# Patient Record
Sex: Female | Born: 1995 | Hispanic: Yes | Marital: Single | Smoking: Never smoker
Health system: Southern US, Community
[De-identification: ages and names within clinical notes are randomized; demographics above are authoritative.]

---
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
## 2013-07-13 ENCOUNTER — Observation Stay: Payer: Self-pay | Admitting: Obstetrics and Gynecology

## 2013-07-13 LAB — URINALYSIS, COMPLETE
Bacteria: NONE SEEN
Glucose,UR: NEGATIVE mg/dL (ref 0–75)
Nitrite: NEGATIVE
Ph: 6 (ref 4.5–8.0)
RBC,UR: 42 /HPF (ref 0–5)
Specific Gravity: 1.013 (ref 1.003–1.030)

## 2013-07-13 LAB — FETAL FIBRONECTIN
Appearance: NORMAL
Fetal Fibronectin: NEGATIVE

## 2014-02-01 ENCOUNTER — Emergency Department: Payer: Self-pay | Admitting: Emergency Medicine

## 2014-02-01 LAB — CBC WITH DIFFERENTIAL/PLATELET
Basophil #: 0.1 10*3/uL (ref 0.0–0.1)
Basophil %: 0.9 %
EOS PCT: 0.9 %
Eosinophil #: 0.1 10*3/uL (ref 0.0–0.7)
HCT: 40.6 % (ref 35.0–47.0)
HGB: 13.1 g/dL (ref 12.0–16.0)
LYMPHS PCT: 35.8 %
Lymphocyte #: 3.8 10*3/uL — ABNORMAL HIGH (ref 1.0–3.6)
MCH: 27.6 pg (ref 26.0–34.0)
MCHC: 32.2 g/dL (ref 32.0–36.0)
MCV: 86 fL (ref 80–100)
Monocyte #: 0.7 x10 3/mm (ref 0.2–0.9)
Monocyte %: 6.4 %
NEUTROS ABS: 5.9 10*3/uL (ref 1.4–6.5)
NEUTROS PCT: 56 %
PLATELETS: 320 10*3/uL (ref 150–440)
RBC: 4.74 10*6/uL (ref 3.80–5.20)
RDW: 13.8 % (ref 11.5–14.5)
WBC: 10.5 10*3/uL (ref 3.6–11.0)

## 2014-02-01 LAB — COMPREHENSIVE METABOLIC PANEL
ALK PHOS: 110 U/L
ANION GAP: 10 (ref 7–16)
Albumin: 4.4 g/dL (ref 3.8–5.6)
BUN: 13 mg/dL (ref 7–18)
Bilirubin,Total: 0.3 mg/dL (ref 0.2–1.0)
CO2: 21 mmol/L (ref 21–32)
CREATININE: 0.54 mg/dL — AB (ref 0.60–1.30)
Calcium, Total: 9 mg/dL (ref 9.0–10.7)
Chloride: 109 mmol/L — ABNORMAL HIGH (ref 98–107)
Glucose: 99 mg/dL (ref 65–99)
Osmolality: 280 (ref 275–301)
Potassium: 3.3 mmol/L — ABNORMAL LOW (ref 3.5–5.1)
SGOT(AST): 36 U/L — ABNORMAL HIGH (ref 0–26)
SGPT (ALT): 64 U/L (ref 12–78)
SODIUM: 140 mmol/L (ref 136–145)
TOTAL PROTEIN: 8.1 g/dL (ref 6.4–8.6)

## 2014-02-02 LAB — URINALYSIS, COMPLETE
Bacteria: NONE SEEN
Bilirubin,UR: NEGATIVE
Glucose,UR: NEGATIVE mg/dL (ref 0–75)
Leukocyte Esterase: NEGATIVE
NITRITE: NEGATIVE
Ph: 5 (ref 4.5–8.0)
SPECIFIC GRAVITY: 1.025 (ref 1.003–1.030)
WBC UR: 6 /HPF (ref 0–5)

## 2014-02-02 LAB — WET PREP, GENITAL

## 2014-02-02 LAB — GC/CHLAMYDIA PROBE AMP

## 2015-08-05 ENCOUNTER — Emergency Department
Admission: EM | Admit: 2015-08-05 | Discharge: 2015-08-05 | Disposition: A | Discharge status: Home / Self Care | Payer: Worker's Compensation | Attending: Emergency Medicine | Admitting: Emergency Medicine

## 2015-08-05 ENCOUNTER — Encounter: Payer: Self-pay | Admitting: Emergency Medicine

## 2015-08-05 DIAGNOSIS — Y288XXA Contact with other sharp object, undetermined intent, initial encounter: Secondary | ICD-10-CM | POA: Diagnosis not present

## 2015-08-05 DIAGNOSIS — Y9389 Activity, other specified: Secondary | ICD-10-CM | POA: Diagnosis not present

## 2015-08-05 DIAGNOSIS — S61412A Laceration without foreign body of left hand, initial encounter: Secondary | ICD-10-CM

## 2015-08-05 DIAGNOSIS — Y990 Civilian activity done for income or pay: Secondary | ICD-10-CM | POA: Diagnosis not present

## 2015-08-05 DIAGNOSIS — Y9289 Other specified places as the place of occurrence of the external cause: Secondary | ICD-10-CM | POA: Diagnosis not present

## 2015-08-05 DIAGNOSIS — Z23 Encounter for immunization: Secondary | ICD-10-CM | POA: Insufficient documentation

## 2015-08-05 DIAGNOSIS — S61012A Laceration without foreign body of left thumb without damage to nail, initial encounter: Secondary | ICD-10-CM | POA: Insufficient documentation

## 2015-08-05 MED ORDER — LIDOCAINE-EPINEPHRINE (PF) 1 %-1:200000 IJ SOLN
INTRAMUSCULAR | Status: AC
  Filled 2015-08-05: qty 30

## 2015-08-05 MED ORDER — TETANUS-DIPHTH-ACELL PERTUSSIS 5-2.5-18.5 LF-MCG/0.5 IM SUSP
0.5000 mL | Freq: Once | INTRAMUSCULAR | Status: AC
  Administered 2015-08-05: 0.5 mL via INTRAMUSCULAR
  Filled 2015-08-05: qty 0.5

## 2015-08-05 MED ORDER — LIDOCAINE HCL (PF) 1 % IJ SOLN
INTRAMUSCULAR | Status: AC
  Filled 2015-08-05: qty 10

## 2015-08-05 MED ORDER — HYDROCODONE-ACETAMINOPHEN 5-325 MG PO TABS: 1.0000 | ORAL_TABLET | ORAL | Status: AC | PRN

## 2015-08-05 MED ORDER — LIDOCAINE-EPINEPHRINE (PF) 2 %-1:200000 IJ SOLN
20.0000 mL | Freq: Once | INTRAMUSCULAR | Status: DC
  Filled 2015-08-05: qty 20

## 2015-08-05 NOTE — ED Provider Notes (Signed)
[facility] Emergency Department Provider Note  ____________________________________________  Time seen: Approximately 9:26 AM  I have reviewed the triage vital signs and the nursing notes.   HISTORY  Chief Complaint Laceration    HPI Yolanda Rodriguez is a 19 y.o. female presents emergency department complaining of laceration to her left hand.She states that she was at work using a hooked knife when it slipped and cut her hand. She states she immediately covered the area and was able to stop bleeding prior to arrival. She states that she does not have any bleeding disorders and is not on any blood thinners. Any numbness or tingling into her distal phalanges. She reports she still has range of motion to thumb and all fingers.   History reviewed. No pertinent past medical history.  There are no active problems to display for this patient.   History reviewed. No pertinent past surgical history.  Current Outpatient Rx  Name  Route  Sig  Dispense  Refill  . HYDROcodone-acetaminophen (NORCO/VICODIN) 5-325 MG tablet   Oral   Take 1 tablet by mouth every 4 (four) hours as needed for moderate pain.   10 tablet   0     Allergies Review of patient's allergies indicates no known allergies.  No family history on file.  Social History Social History  Substance Use Topics  . Smoking status: Never Smoker   . Smokeless tobacco: None  . Alcohol Use: No    Review of Systems Constitutional: No fever/chills Eyes: No visual changes. ENT: No sore throat. Cardiovascular: Denies chest pain. Respiratory: Denies shortness of breath. Gastrointestinal: No abdominal pain.  No nausea, no vomiting.  No diarrhea.  No constipation. Genitourinary: Negative for dysuria. Musculoskeletal: Negative for back pain. Skin: Negative for rash. Endorses a laceration just proximal to the left thumb. Neurological: Negative for headaches, focal weakness or numbness.  10-point ROS  otherwise negative.  ____________________________________________   PHYSICAL EXAM:  VITAL SIGNS: ED Triage Vitals  Enc Vitals Group     BP 08/05/15 0916 121/79 mmHg     Pulse Rate 08/05/15 0916 85     Resp 08/05/15 0915 20     Temp 08/05/15 0915 98.2 F (36.8 C)     Temp Source 08/05/15 0915 Oral     SpO2 08/05/15 0916 98 %     Weight 08/05/15 0915 140 lb (63.504 kg)     Height 08/05/15 0915 5\' 1"  (1.549 m)     Head Cir --      Peak Flow --      Pain Score 08/05/15 0915 4     Pain Loc --      Pain Edu? --      Excl. in GC? --     Constitutional: Alert and oriented. Well appearing and in no acute distress. Eyes: Conjunctivae are normal. PERRL. EOMI. Head: Atraumatic. Nose: No congestion/rhinnorhea. Mouth/Throat: Mucous membranes are moist.  Oropharynx non-erythematous. Neck: No stridor.   Cardiovascular: Normal rate, regular rhythm. Grossly normal heart sounds.  Good peripheral circulation. Respiratory: Normal respiratory effort.  No retractions. Lungs CTAB. Gastrointestinal: Soft and nontender. No distention. No abdominal bruits. No CVA tenderness. Musculoskeletal: No lower extremity tenderness nor edema.  No joint effusions. Neurologic:  Normal speech and language. No gross focal neurologic deficits are appreciated. No gait instability. Skin:  Skin is warm, dry and intact. No rash noted. Deep laceration noted to the interdigital space between thumb and second finger extending to the proximal thumb. Patient has sensation and  brisk cap refill. No visible foreign body. No tendon involvement. Full range of motion to thumb and index finger. Laceration is approximately 6 cm in length. Psychiatric: Mood and affect are normal. Speech and behavior are normal.  ____________________________________________   LABS (all labs ordered are listed, but only abnormal results are displayed)  Labs Reviewed - No data to  display ____________________________________________  EKG   ____________________________________________  RADIOLOGY   ____________________________________________   PROCEDURES  Procedure(s) performed: Yes, laceration repair, see procedure note(s).   LACERATION REPAIR Performed by: Racheal Patches Authorized by: Delorise Royals Cuthriell Consent: Verbal consent obtained. Risks and benefits: risks, benefits and alternatives were discussed Consent given by: patient Patient identity confirmed: provided demographic data Prepped and Draped in normal sterile fashion Wound thoroughly explored  Laceration Location: Interdigital space left hand between thumb and second digit extending to proximal thumb  Laceration Length: 6 cm  No Foreign Bodies seen or palpated  Anesthesia: local infiltration  Local anesthetic: lidocaine 1 % without epinephrine  Anesthetic total: 15 ml  Irrigation method: syringe Amount of cleaning: standard  Skin closure: Sutured, 4-0 Ethilon   Number of sutures: 7   Technique: Simple interrupted   Patient tolerance: Patient tolerated the procedure well with no immediate complications.   Critical Care performed: No  ____________________________________________   INITIAL IMPRESSION / ASSESSMENT AND PLAN / ED COURSE  Pertinent labs & imaging results that were available during my care of the patient were reviewed by me and considered in my medical decision making (see chart for details).  The patient's history, symptoms, physical exam are consistent with laceration to left hand. There is no tendon involvement. Wound was thoroughly explored and cleansed. Wound was primarily closed using simple interrupted sutures, 7 of which were placed. Wound was well approximated. Patient had good PMS both prior to and status post procedure. Patient was given instructions for wound care. Area was bandaged prior to discharge. Patient discharged home with  prescription for pain. Otherwise patient is to return in 7-10 days for suture removal. Lyses understanding of diagnosis and treatment plan and verbalizes compliance with same. ____________________________________________   FINAL CLINICAL IMPRESSION(S) / ED DIAGNOSES  Final diagnoses:  Hand laceration, left, initial encounter      Racheal Patches, PA-C 08/05/15 1134  Loleta Rose, MD 08/05/15 1416

## 2015-08-05 NOTE — Discharge Instructions (Signed)

## 2015-08-05 NOTE — ED Notes (Signed)
Laceration to left hand at work

## 2015-08-05 NOTE — ED Notes (Signed)
States she was cleaning yarn from a machine laceration noted to left hand ..near thumb area

## 2015-08-14 ENCOUNTER — Emergency Department
Admission: EM | Admit: 2015-08-14 | Discharge: 2015-08-14 | Disposition: A | Discharge status: Home / Self Care | Payer: Worker's Compensation | Attending: Emergency Medicine | Admitting: Emergency Medicine

## 2015-08-14 ENCOUNTER — Encounter: Payer: Self-pay | Admitting: Emergency Medicine

## 2015-08-14 DIAGNOSIS — Z4802 Encounter for removal of sutures: Secondary | ICD-10-CM

## 2015-08-14 DIAGNOSIS — Z4801 Encounter for change or removal of surgical wound dressing: Secondary | ICD-10-CM | POA: Diagnosis present

## 2015-08-14 NOTE — ED Provider Notes (Signed)
[facility] Emergency Department Provider Note  ____________________________________________  Time seen: Approximately 1:43 PM  I have reviewed the triage vital signs and the nursing notes.   HISTORY  Chief Complaint Suture / Staple Removal    HPI Yolanda Rodriguez is a 19 y.o. female patient's history today for suture removal from laceration is placed on 08/05/2015. Patient was no complaint on the healing process. Patient rates her pain discomfort as a 3/10. Patient does no swelling or redness to the area.   History reviewed. No pertinent past medical history.  There are no active problems to display for this patient.   History reviewed. No pertinent past surgical history.  Current Outpatient Rx  Name  Route  Sig  Dispense  Refill  . HYDROcodone-acetaminophen (NORCO/VICODIN) 5-325 MG tablet   Oral   Take 1 tablet by mouth every 4 (four) hours as needed for moderate pain.   10 tablet   0     Allergies Review of patient's allergies indicates no known allergies.  No family history on file.  Social History Social History  Substance Use Topics  . Smoking status: Never Smoker   . Smokeless tobacco: None  . Alcohol Use: No    Review of Systems Constitutional: No fever/chills Eyes: No visual changes. ENT: No sore throat. Cardiovascular: Denies chest pain. Respiratory: Denies shortness of breath. Gastrointestinal: No abdominal pain.  No nausea, no vomiting.  No diarrhea.  No constipation. Genitourinary: Negative for dysuria. Musculoskeletal: Negative for back pain. Skin: Negative for rash. Laceration which was sutured on the left hand. Neurological: Negative for headaches, focal weakness or numbness. 10-point ROS otherwise negative.  ____________________________________________   PHYSICAL EXAM:  VITAL SIGNS: ED Triage Vitals  Enc Vitals Group     BP 08/14/15 1322 118/71 mmHg     Pulse Rate 08/14/15 1322 77     Resp 08/14/15 1322 16      Temp 08/14/15 1322 98.3 F (36.8 C)     Temp Source 08/14/15 1322 Oral     SpO2 08/14/15 1322 99 %     Weight 08/14/15 1322 140 lb (63.504 kg)     Height 08/14/15 1322 5\' 1"  (1.549 m)     Head Cir --      Peak Flow --      Pain Score 08/14/15 1324 3     Pain Loc --      Pain Edu? --      Excl. in GC? --     Constitutional: Alert and oriented. Well appearing and in no acute distress. Eyes: Conjunctivae are normal. PERRL. EOMI. Head: Atraumatic. Nose: No congestion/rhinnorhea. Mouth/Throat: Mucous membranes are moist.  Oropharynx non-erythematous. Neck: No stridor. No cervical spine tenderness to palpation. Hematological/Lymphatic/Immunilogical: No cervical lymphadenopathy. Cardiovascular: Normal rate, regular rhythm. Grossly normal heart sounds.  Good peripheral circulation. Respiratory: Normal respiratory effort.  No retractions. Lungs CTAB. Gastrointestinal: Soft and nontender. No distention. No abdominal bruits. No CVA tenderness. Musculoskeletal: No lower extremity tenderness nor edema.  No joint effusions. Neurologic:  Normal speech and language. No gross focal neurologic deficits are appreciated. No gait instability. Skin:  Skin is warm, dry and intact. No rash noted. Laceration showed good approximation with no signs symptoms secondary infection. Neurovascular intact free nuchal range of motion.  Psychiatric: Mood and affect are normal. Speech and behavior are normal.  ____________________________________________   LABS (all labs ordered are listed, but only abnormal results are displayed)  Labs Reviewed - No data to display ____________________________________________  EKG  ____________________________________________  RADIOLOGY   ____________________________________________   PROCEDURES  Procedure(s) performed: None  Critical Care performed: No  ____________________________________________   INITIAL IMPRESSION / ASSESSMENT AND PLAN / ED  COURSE  Pertinent labs & imaging results that were available during my care of the patient were reviewed by me and considered in my medical decision making (see chart for details).  Healed laceration left hand. 7 sutures removed and Steri-Strips applied. Patient given discharge instructions home care. Advised return back to ER if condition worsens. ____________________________________________   FINAL CLINICAL IMPRESSION(S) / ED DIAGNOSES  Final diagnoses:  Encounter for removal of sutures      Joni Reining, PA-C 08/14/15 1348  Sharman Cheek, MD 08/15/15 [phone]

## 2015-08-14 NOTE — ED Notes (Signed)
Suture removal from left hand.  Placed 10/29  Left hand wound well approximated, clean, and dry. No redness or swelling noted.

## 2015-08-14 NOTE — Discharge Instructions (Signed)
Remoción de la sutura, cuidados posteriores  (Suture Removal, Care After)  Siga estas instrucciones durante las próximas semanas. Estas indicaciones le proporcionan información general acerca de cómo deberá cuidarse después del procedimiento. El médico también podrá darle instrucciones más específicas. El tratamiento se ha planificado de acuerdo a las prácticas médicas actuales, pero a veces se producen problemas. Comuníquese con el médico si tiene algún problema o tiene dudas después del procedimiento.  QUÉ ESPERAR DESPUÉS DEL PROCEDIMIENTO  Después de que le retiren los puntos (suturas), es normal experimentar lo siguiente:  · Molestias e hinchazón en la zona de la herida.  · Leve enrojecimiento en la zona de la herida.  INSTRUCCIONES PARA EL CUIDADO EN EL HOGAR   · Si tiene bandas adhesivas en la piel sobre la zona de la herida, no las retire. Se caerán solas en unos pocos días. Si las bandas adhesivas siguen en su lugar después de 14 días, entonces puede retirarlas.  · Cambie los apósitos (vendajes), al menos, una vez al día o según las instrucciones del médico. Si el vendaje se adhiere, remójelo con agua jabonosa tibia.  · Solo aplique un ungüento o crema según las indicaciones del médico. Si usa crema o ungüento, lave la zona con agua y jabón dos veces por día para quitarlo todo. Enjuague el jabón y seque suavemente la zona con una toalla limpia.  · Mantenga la herida limpia y seca. Si el vendaje se moja, se ensucia o tiene mal olor, cámbielo tan pronto como pueda.  · Continúe protegiendo la herida de lesiones.  · Use protector solar cuando salga al sol. Las cicatrices nuevas se broncean fácilmente.  SOLICITE ATENCIÓN MÉDICA SI:  · Aumenta el enrojecimiento, la hinchazón o el dolor en la zona afectada.  · Observa que sale pus de la herida.  · Tiene fiebre.  · Advierte un olor fétido que proviene de la herida o del vendaje.  · La herida se abre (los bordes se separan).     Esta información no tiene como fin  reemplazar el consejo del médico. Asegúrese de hacerle al médico cualquier pregunta que tenga.     Document Released: 07/03/2005 Document Revised: 07/14/2013  Elsevier Interactive Patient Education ©2016 [employer]

## 2015-10-02 ENCOUNTER — Emergency Department
Admission: EM | Admit: 2015-10-02 | Discharge: 2015-10-02 | Disposition: A | Discharge status: Home / Self Care | Payer: Self-pay | Attending: Emergency Medicine | Admitting: Emergency Medicine

## 2015-10-02 ENCOUNTER — Encounter: Payer: Self-pay | Admitting: Emergency Medicine

## 2015-10-02 DIAGNOSIS — N6012 Diffuse cystic mastopathy of left breast: Secondary | ICD-10-CM | POA: Insufficient documentation

## 2015-10-02 DIAGNOSIS — N6011 Diffuse cystic mastopathy of right breast: Secondary | ICD-10-CM | POA: Insufficient documentation

## 2015-10-02 DIAGNOSIS — L723 Sebaceous cyst: Secondary | ICD-10-CM | POA: Insufficient documentation

## 2015-10-02 MED ORDER — NAPROXEN 500 MG PO TABS: 500.0000 mg | ORAL_TABLET | Freq: Two times a day (BID) | ORAL | Status: AC

## 2015-10-02 NOTE — ED Provider Notes (Signed)
[facility] Emergency Department Provider Note ____________________________________________  Time seen: Approximately 7:14 PM  I have reviewed the triage vital signs and the nursing notes.   HISTORY  Chief Complaint lump X 2 right breast    HPI Yolanda Rodriguez is a 19 y.o. female who presents to the emergency department for evaluation of tender lumps in right underarm and right breast.She has had a tender area under the right arm for "a long time." Breast tenderness has been present for the past few days and getting worsen. Most of the tenderness is in the right breast and she has noticed "2 lumps." Left breast also tender. She has not had a menstrual cycle in 5 months because she has an implanon.    History reviewed. No pertinent past medical history.  There are no active problems to display for this patient.   History reviewed. No pertinent past surgical history.  Current Outpatient Rx  Name  Route  Sig  Dispense  Refill  . HYDROcodone-acetaminophen (NORCO/VICODIN) 5-325 MG tablet   Oral   Take 1 tablet by mouth every 4 (four) hours as needed for moderate pain.   10 tablet   0   . naproxen (NAPROSYN) 500 MG tablet   Oral   Take 1 tablet (500 mg total) by mouth 2 (two) times daily with a meal.   60 tablet   0     Allergies Review of patient's allergies indicates no known allergies.  History reviewed. No pertinent family history.  Social History Social History  Substance Use Topics  . Smoking status: Never Smoker   . Smokeless tobacco: None  . Alcohol Use: No    Review of Systems   Constitutional: No fever/chills Eyes: No visual changes. ENT: No congestion or rhinorrhea Cardiovascular: Denies chest pain. Respiratory: Denies shortness of breath. Gastrointestinal: No abdominal pain.  No nausea, no vomiting.  No diarrhea.  No constipation. Genitourinary: Negative for dysuria. Musculoskeletal: Negative for back pain. Skin: Positive for  tenderness in the right axilla and bilateral breasts. Neurological: Negative for headaches, focal weakness or numbness.  10-point ROS otherwise negative.  ____________________________________________   PHYSICAL EXAM:  VITAL SIGNS: ED Triage Vitals  Enc Vitals Group     BP 10/02/15 1842 133/89 mmHg     Pulse Rate 10/02/15 1842 89     Resp 10/02/15 1842 16     Temp 10/02/15 1842 99 F (37.2 C)     Temp Source 10/02/15 1842 Oral     SpO2 10/02/15 1842 98 %     Weight 10/02/15 1842 140 lb (63.504 kg)     Height 10/02/15 1842  (1.549 m)     Head Cir --      Peak Flow --      Pain Score 10/02/15 1843 4     Pain Loc --      Pain Edu? --      Excl. in GC? --     Constitutional: Alert and oriented. Well appearing and in no acute distress. Eyes: Conjunctivae are normal. PERRL. EOMI. Head: Atraumatic. Nose: No congestion/rhinnorhea. Mouth/Throat: Mucous membranes are moist.  Oropharynx non-erythematous. No oral lesions. Neck: No stridor. Cardiovascular: Normal rate, regular rhythm.  Good peripheral circulation. Respiratory: Normal respiratory effort.  No retractions. Lungs CTAB. Gastrointestinal: Soft and nontender. No distention. No abdominal bruits.  Musculoskeletal: No lower extremity tenderness nor edema.  No joint effusions. Neurologic:  Normal speech and language. No gross focal neurologic deficits are appreciated. Speech is normal.  No gait instability. Skin:  Small cystic structure noted in right axilla that does not appear to be infected--not erythematous nor fluctuant; Dense, nodular breast tissue bilateral without dimpling or nipple peeling/drainage; Negative for petechiae.  Psychiatric: Mood and affect are normal. Speech and behavior are normal.  ____________________________________________   LABS (all labs ordered are listed, but only abnormal results are displayed)  Labs Reviewed - No data to  display ____________________________________________  EKG   ____________________________________________  RADIOLOGY   ____________________________________________   PROCEDURES  Procedure(s) performed: None ____________________________________________   INITIAL IMPRESSION / ASSESSMENT AND PLAN / ED COURSE  Pertinent labs & imaging results that were available during my care of the patient were reviewed by me and considered in my medical decision making (see chart for details).  Patient was advised to follow up with gynecology. She was advised to return to the emergency department for changes or symptoms of concern. ____________________________________________   FINAL CLINICAL IMPRESSION(S) / ED DIAGNOSES  Final diagnoses:  Fibrocystic breast changes of both breasts  Sebaceous cyst of right axilla       Chinita PesterCari B Milina Pagett, FNP 10/02/15 2310  Darien Ramusavid W Kaminski, MD 10/02/15 2330

## 2015-10-02 NOTE — ED Notes (Signed)
Pt c/o palpable lump X 2 in right breast with breast pain. Denies fevers or drainage.

## 2018-05-19 ENCOUNTER — Encounter: Payer: Self-pay | Admitting: Emergency Medicine

## 2018-05-19 ENCOUNTER — Other Ambulatory Visit: Payer: Self-pay

## 2018-05-19 ENCOUNTER — Emergency Department: Payer: BLUE CROSS/BLUE SHIELD

## 2018-05-19 ENCOUNTER — Emergency Department
Admission: EM | Admit: 2018-05-19 | Discharge: 2018-05-19 | Disposition: A | Discharge status: Home / Self Care | Payer: BLUE CROSS/BLUE SHIELD | Attending: Emergency Medicine | Admitting: Emergency Medicine

## 2018-05-19 DIAGNOSIS — Y998 Other external cause status: Secondary | ICD-10-CM | POA: Diagnosis not present

## 2018-05-19 DIAGNOSIS — S22000A Wedge compression fracture of unspecified thoracic vertebra, initial encounter for closed fracture: Secondary | ICD-10-CM

## 2018-05-19 DIAGNOSIS — S39012A Strain of muscle, fascia and tendon of lower back, initial encounter: Secondary | ICD-10-CM | POA: Diagnosis not present

## 2018-05-19 DIAGNOSIS — S161XXA Strain of muscle, fascia and tendon at neck level, initial encounter: Secondary | ICD-10-CM | POA: Diagnosis not present

## 2018-05-19 DIAGNOSIS — Y9389 Activity, other specified: Secondary | ICD-10-CM | POA: Diagnosis not present

## 2018-05-19 DIAGNOSIS — S060X0A Concussion without loss of consciousness, initial encounter: Secondary | ICD-10-CM

## 2018-05-19 DIAGNOSIS — S0990XA Unspecified injury of head, initial encounter: Secondary | ICD-10-CM | POA: Diagnosis present

## 2018-05-19 DIAGNOSIS — Y92410 Unspecified street and highway as the place of occurrence of the external cause: Secondary | ICD-10-CM | POA: Insufficient documentation

## 2018-05-19 LAB — POCT PREGNANCY, URINE: Preg Test, Ur: NEGATIVE

## 2018-05-19 MED ORDER — MELOXICAM 15 MG PO TABS: 15.0000 mg | ORAL_TABLET | Freq: Every day | ORAL | 0 refills | Status: AC

## 2018-05-19 MED ORDER — ONDANSETRON 4 MG PO TBDP: 4.0000 mg | ORAL_TABLET | Freq: Three times a day (TID) | ORAL | 0 refills | Status: AC | PRN

## 2018-05-19 MED ORDER — METHOCARBAMOL 500 MG PO TABS: 500.0000 mg | ORAL_TABLET | Freq: Four times a day (QID) | ORAL | 0 refills | Status: AC
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
